# Patient Record
Sex: Female | Born: 1940 | Race: White | Hispanic: No
Health system: Southern US, Community
[De-identification: ages and names within clinical notes are randomized; demographics above are authoritative.]

## PROBLEM LIST (undated history)

## (undated) DIAGNOSIS — F419 Anxiety disorder, unspecified: Secondary | ICD-10-CM

## (undated) DIAGNOSIS — E119 Type 2 diabetes mellitus without complications: Secondary | ICD-10-CM

## (undated) DIAGNOSIS — E079 Disorder of thyroid, unspecified: Secondary | ICD-10-CM

## (undated) DIAGNOSIS — J45909 Unspecified asthma, uncomplicated: Secondary | ICD-10-CM

## (undated) DIAGNOSIS — J449 Chronic obstructive pulmonary disease, unspecified: Secondary | ICD-10-CM

## (undated) HISTORY — PX: CARDIAC SURGERY: SHX584

---

## 2019-10-08 ENCOUNTER — Emergency Department (HOSPITAL_COMMUNITY): Payer: Medicare Other

## 2019-10-08 ENCOUNTER — Other Ambulatory Visit: Payer: Self-pay

## 2019-10-08 ENCOUNTER — Encounter (HOSPITAL_COMMUNITY): Payer: Self-pay | Admitting: *Deleted

## 2019-10-08 DIAGNOSIS — R42 Dizziness and giddiness: Secondary | ICD-10-CM | POA: Diagnosis not present

## 2019-10-08 DIAGNOSIS — Z79899 Other long term (current) drug therapy: Secondary | ICD-10-CM | POA: Insufficient documentation

## 2019-10-08 DIAGNOSIS — R002 Palpitations: Secondary | ICD-10-CM | POA: Insufficient documentation

## 2019-10-08 DIAGNOSIS — E039 Hypothyroidism, unspecified: Secondary | ICD-10-CM | POA: Insufficient documentation

## 2019-10-08 DIAGNOSIS — J449 Chronic obstructive pulmonary disease, unspecified: Secondary | ICD-10-CM | POA: Insufficient documentation

## 2019-10-08 DIAGNOSIS — Z7982 Long term (current) use of aspirin: Secondary | ICD-10-CM | POA: Diagnosis not present

## 2019-10-08 DIAGNOSIS — E119 Type 2 diabetes mellitus without complications: Secondary | ICD-10-CM | POA: Insufficient documentation

## 2019-10-08 DIAGNOSIS — R2243 Localized swelling, mass and lump, lower limb, bilateral: Secondary | ICD-10-CM | POA: Diagnosis not present

## 2019-10-08 DIAGNOSIS — R000 Tachycardia, unspecified: Secondary | ICD-10-CM | POA: Diagnosis not present

## 2019-10-08 DIAGNOSIS — Z7984 Long term (current) use of oral hypoglycemic drugs: Secondary | ICD-10-CM | POA: Insufficient documentation

## 2019-10-08 MED ORDER — SODIUM CHLORIDE 0.9% FLUSH
3.0000 mL | Freq: Once | INTRAVENOUS | Status: AC
  Administered 2019-10-09: 3 mL via INTRAVENOUS

## 2019-10-08 NOTE — ED Triage Notes (Signed)
Pt says she feels like her heart is "fluttering" for a couple of days, worse today. Says she measured her pulse has been around 115, and someone felt her pulse and said it was irregular. Denies shortness of breath.

## 2019-10-09 ENCOUNTER — Encounter (HOSPITAL_COMMUNITY): Payer: Self-pay | Admitting: Emergency Medicine

## 2019-10-09 ENCOUNTER — Other Ambulatory Visit: Payer: Self-pay

## 2019-10-09 ENCOUNTER — Emergency Department (HOSPITAL_COMMUNITY)
Admission: EM | Admit: 2019-10-09 | Discharge: 2019-10-09 | Disposition: A | Discharge status: Home / Self Care | Payer: Medicare Other | Attending: Emergency Medicine | Admitting: Emergency Medicine

## 2019-10-09 DIAGNOSIS — R002 Palpitations: Secondary | ICD-10-CM

## 2019-10-09 HISTORY — DX: Type 2 diabetes mellitus without complications: E11.9

## 2019-10-09 HISTORY — DX: Unspecified asthma, uncomplicated: J45.909

## 2019-10-09 HISTORY — DX: Anxiety disorder, unspecified: F41.9

## 2019-10-09 HISTORY — DX: Disorder of thyroid, unspecified: E07.9

## 2019-10-09 HISTORY — DX: Chronic obstructive pulmonary disease, unspecified: J44.9

## 2019-10-09 LAB — URINALYSIS, ROUTINE W REFLEX MICROSCOPIC
Bilirubin Urine: NEGATIVE
Glucose, UA: NEGATIVE mg/dL
Hgb urine dipstick: NEGATIVE
Ketones, ur: NEGATIVE mg/dL
Nitrite: NEGATIVE
Protein, ur: NEGATIVE mg/dL
Specific Gravity, Urine: 1.01 (ref 1.005–1.030)
pH: 7 (ref 5.0–8.0)

## 2019-10-09 LAB — CBC
HCT: 41.2 % (ref 36.0–46.0)
Hemoglobin: 13.2 g/dL (ref 12.0–15.0)
MCH: 30.6 pg (ref 26.0–34.0)
MCHC: 32 g/dL (ref 30.0–36.0)
MCV: 95.4 fL (ref 80.0–100.0)
Platelets: 195 10*3/uL (ref 150–400)
RBC: 4.32 MIL/uL (ref 3.87–5.11)
RDW: 13.8 % (ref 11.5–15.5)
WBC: 8 10*3/uL (ref 4.0–10.5)
nRBC: 0 % (ref 0.0–0.2)

## 2019-10-09 LAB — TSH: TSH: 1.18 u[IU]/mL (ref 0.350–4.500)

## 2019-10-09 LAB — BASIC METABOLIC PANEL
Anion gap: 10 (ref 5–15)
BUN: 18 mg/dL (ref 8–23)
CO2: 27 mmol/L (ref 22–32)
Calcium: 9.1 mg/dL (ref 8.9–10.3)
Chloride: 104 mmol/L (ref 98–111)
Creatinine, Ser: 0.7 mg/dL (ref 0.44–1.00)
GFR calc Af Amer: >60 mL/min (ref >60)
GFR calc non Af Amer: >60 mL/min (ref >60)
Glucose, Bld: 109 mg/dL — ABNORMAL HIGH (ref 70–99)
Potassium: 3.5 mmol/L (ref 3.5–5.1)
Sodium: 141 mmol/L (ref 135–145)

## 2019-10-09 LAB — CBG MONITORING, ED: Glucose-Capillary: 97 mg/dL (ref 70–99)

## 2019-10-09 MED ORDER — SODIUM CHLORIDE 0.9 % IV BOLUS
1000.0000 mL | Freq: Once | INTRAVENOUS | Status: AC
  Administered 2019-10-09: 1000 mL via INTRAVENOUS

## 2019-10-09 NOTE — ED Notes (Signed)
Pt given graham crackers, peanut butter and diet Sprite per her request.

## 2019-10-09 NOTE — Discharge Instructions (Addendum)
You were seen in the emergency department for having more frequent palpitations.  You had a chest x-ray EKG and blood work that did not show any obvious abnormalities.  Your thyroid level was normal.  Please keep hydrated and follow-up with your regular doctor.  Return to the emergency department for any worsening or concerning symptoms.

## 2019-10-09 NOTE — ED Provider Notes (Signed)
[facility] EMERGENCY DEPARTMENT Provider Note   CSN: [id number] Arrival date & time: 10/08/19  2141     History Chief Complaint  Patient presents with  . Palpitations    Jody Chung is a 79 y.o. female.  She has a history of COPD diabetes thyroid disease.  She is here with 5 days of increased palpitations, feeling of fluttering in her chest.  She said she is had in the past but not as frequent as this.  Makes her feel little bit lightheaded.  No chest pain.  No shortness of breath.  She said one of her neighbors who is an EMT checked on her and found her heart rate to be 115 which is abnormal for her.  No recent changes in her medications.  No infectious symptoms.  The history is provided by the patient.  Palpitations Palpitations quality:  Irregular Onset quality:  Gradual Duration:  5 days Timing:  Intermittent Progression:  Unchanged Chronicity:  Recurrent Relieved by:  Nothing Worsened by:  Nothing Ineffective treatments:  None tried Associated symptoms: dizziness   Associated symptoms: no chest pain, no cough, no diaphoresis, no nausea, no shortness of breath and no vomiting   Risk factors: diabetes mellitus and hx of thyroid disease        Past Medical History:  Diagnosis Date  . Anxiety   . Asthma   . COPD (chronic obstructive pulmonary disease) (HCC)   . Diabetes mellitus without complication (HCC)   . Thyroid disease     There are no problems to display for this patient.   Past Surgical History:  Procedure Laterality Date  . CARDIAC SURGERY       OB History   No obstetric history on file.     No family history on file.  Social History   Tobacco Use  . Smoking status: Not on file  Substance Use Topics  . Alcohol use: Not on file  . Drug use: Not on file    Home Medications Prior to Admission medications   Medication Sig Start Date End Date Taking? Authorizing Provider  albuterol (VENTOLIN HFA) 108 (90 Base) MCG/ACT inhaler Inhale 2 puffs  into the lungs every 6 (six) hours as needed. 12/15/16  Yes [provider]  aspirin 81 MG EC tablet Take 1 tablet by mouth daily.   Yes [provider]  calcium carbonate (OSCAL) 1500 (600 Ca) MG TABS tablet Take 600 mg of elemental calcium by mouth daily with breakfast.   Yes [provider]  Cholecalciferol 25 MCG (1000 UT) capsule Take 1 capsule by mouth daily.   Yes [provider]  conjugated estrogens (PREMARIN) vaginal cream Place 0.25 g vaginally once a week. 05/03/19  Yes [provider]  ELDERBERRY PO Take 1,250 mg by mouth daily.   Yes [provider]  fluconazole (DIFLUCAN) 150 MG tablet Take 1 tablet by mouth daily as needed. 06/12/19  Yes [provider]  glucosamine-chondroitin 500-400 MG tablet Take 3 tablets by mouth daily.   Yes [provider]  levothyroxine (SYNTHROID) 75 MCG tablet Take 75 mcg by mouth daily. 07/22/19  Yes [provider]  metFORMIN (GLUCOPHAGE) 500 MG tablet Take 500 mg by mouth 2 (two) times daily. 05/03/19  Yes [provider]  Multiple Vitamin (MULTIVITAMIN WITH MINERALS) TABS tablet Take 1 tablet by mouth daily.   Yes [provider]  Multiple Vitamins-Minerals (HAIR SKIN AND NAILS FORMULA) TABS Take 1 tablet by mouth daily.   Yes [provider]  niacin 500 MG tablet Take 1 tablet by mouth daily.   Yes [provider]  omeprazole (PRILOSEC) 20 MG capsule Take 20 mg by mouth daily. 05/08/19  Yes [provider]  Probiotic Product (MISC INTESTINAL FLORA REGULAT) CHEW Chew 1 tablet by mouth daily.   Yes [provider]  tiotropium (SPIRIVA) 18 MCG inhalation capsule Place 1 capsule into inhaler and inhale daily. 12/15/16  Yes [provider]  Turmeric (QC TUMERIC COMPLEX) 500 MG CAPS Take 1 capsule by mouth daily.   Yes [provider]  vitamin B-12 (CYANOCOBALAMIN) 1000 MCG tablet Take 1,000 mcg by mouth daily.    Yes [provider]  Monte Fantasia INHUB 250-50 MCG/DOSE AEPB Inhale 1 puff into the lungs 2 (two) times daily. 08/30/19  Yes [provider]    Allergies    Patient has no allergy information on record.  Review of Systems   Review of Systems  Constitutional: Negative for diaphoresis and fever.  HENT: Negative for sore throat.   Eyes: Negative for visual disturbance.  Respiratory: Negative for cough and shortness of breath.   Cardiovascular: Positive for palpitations. Negative for chest pain.  Gastrointestinal: Negative for abdominal pain, nausea and vomiting.  Genitourinary: Negative for dysuria.  Musculoskeletal: Negative for neck pain.  Skin: Positive for wound (rle healing from fall). Negative for rash.  Neurological: Positive for dizziness.    Physical Exam Updated Vital Signs BP (!) 150/73 (BP Location: Left Arm)   Pulse 73   Temp 97.8 F (36.6 C) (Oral)   Resp 18   Ht 5\' 3"  (1.6 m)   Wt 73.5 kg   SpO2 95%   BMI 28.70 kg/m   Physical Exam Vitals and nursing note reviewed.  Constitutional:      General: She is not in acute distress.    Appearance: Normal appearance. She is well-developed.  HENT:     Head: Normocephalic and atraumatic.  Eyes:     Conjunctiva/sclera: Conjunctivae normal.  Cardiovascular:     Rate and Rhythm: Normal rate and regular rhythm.     Heart sounds: No murmur heard.   Pulmonary:     Effort: Pulmonary effort is normal. No respiratory distress.     Breath sounds: Normal breath sounds.  Abdominal:     Palpations: Abdomen is soft.     Tenderness: There is no abdominal tenderness.  Musculoskeletal:        General: Signs of injury present. Normal range of motion.     Cervical back: Neck supple.     Comments: Swelling anterior and tender RLE, no calf pain  Skin:    General: Skin is warm and dry.     Capillary Refill: Capillary refill takes less than 2 seconds.  Neurological:     General: No focal deficit present.     Mental  Status: She is alert.     ED Results / Procedures / Treatments   Labs (all labs ordered are listed, but only abnormal results are displayed) Labs Reviewed  BASIC METABOLIC PANEL - Abnormal; Notable for the following components:      Result Value   Glucose, Bld 109 (*)    All other components within normal limits  URINALYSIS, ROUTINE W REFLEX MICROSCOPIC - Abnormal; Notable for the following components:   APPearance CLOUDY (*)    Leukocytes,Ua MODERATE (*)    Bacteria, UA RARE (*)    All other components within normal limits  URINE CULTURE  CBC  TSH  CBG  MONITORING, ED    EKG EKG Interpretation  Date/Time:  Monday October 08 2019 22:03:06 EDT Ventricular Rate:  102 PR Interval:  166 QRS Duration: 64 QT Interval:  336 QTC Calculation: 437 R Axis:   81 Text Interpretation: Sinus tachycardia Nonspecific ST abnormality Abnormal ECG Interpretation limited secondary to artifact No previous ECGs available Confirmed by Zadie Rhine ([id number]) on 10/09/2019 3:01:53 AM   Radiology DG Chest 2 View  Result Date: 10/08/2019 CLINICAL DATA:  Palpitations, tachycardia EXAM: CHEST - 2 VIEW COMPARISON:  None. FINDINGS: The heart size and mediastinal contours are within normal limits. Both lungs are clear. The visualized skeletal structures are unremarkable. IMPRESSION: No active cardiopulmonary disease. Electronically Signed   By: Helyn Numbers MD   On: 10/08/2019 22:46    Procedures Procedures (including critical care time)  Medications Ordered in ED Medications  sodium chloride flush (NS) 0.9 % injection 3 mL (3 mLs Intravenous Given 10/09/19 1008)  sodium chloride 0.9 % bolus 1,000 mL (0 mLs Intravenous Stopped 10/09/19 1115)    ED Course  I have reviewed the triage vital signs and the nursing notes.  Pertinent labs & imaging results that were available during my care of the patient were reviewed by me and considered in my medical decision making (see chart for  details).  Clinical Course as of Oct 08 1804  Tue Oct 09, 2019  9211 Repeat EKG shows normal sinus rhythm normal intervals no acute ST-T's.  Rate of 69.   [MB]  1053 While patient was here she began experiencing shaking and feeling fluttering in her chest.  She looks very anxious and distressed by this.  Heart rate remained in the 80s.  Due to her trembling hard to see of sinus but looks regular.  IV fluids are infusing so do not know if she is just chilled from that.  Giving her warm blankets and she is eating something.   [MB]  1104 Reevaluated patient and she is feeling better now   [MB]    Clinical Course User Index [MB] Terrilee Files, MD   MDM Rules/Calculators/A&P                         This patient complains of fluttering in her chest; this involves an extensive number of treatment Options and is a complaint that carries with it a high risk of complications and Morbidity. The differential includes palpitations, A. fib, a flutter, arrhythmia, PVCs, metabolic derangement, ACS, pneumonia, GERD, anxiety  I ordered, reviewed and interpreted labs, which included CBC with normal white count normal hemoglobin, chemistries normal other than mildly elevated glucose, TSH normal, urinalysis likely contaminated with 21-50 whites but 21-50 squamous cells nitrite negative sent for culture I ordered medication IV fluids I ordered imaging studies which included chest x-ray and I independently    visualized and interpreted imaging which showed no acute disease Additional history obtained from patient's son Previous records obtained and reviewed in epic, minimal records, she is from [place]  After the interventions stated above, I reevaluated the patient and found patient to be feeling improved.  Reviewed her work-up with her.  She is exhausted from being her so long.  Recommend follow-up with her primary care doctor and return instructions discussed.   Final Clinical Impression(s) / ED  Diagnoses Final diagnoses:  Palpitations    Rx / DC Orders ED Discharge Orders    None       Terrilee Files, MD 10/09/19  1808  

## 2019-10-10 LAB — URINE CULTURE

## 2020-12-10 IMAGING — DX DG CHEST 2V
2 series · 2 of 2 positions shown · non-contrast
Comparison: None.

CLINICAL DATA: Palpitations, tachycardia

EXAM:
CHEST - 2 VIEW

[chest lat]
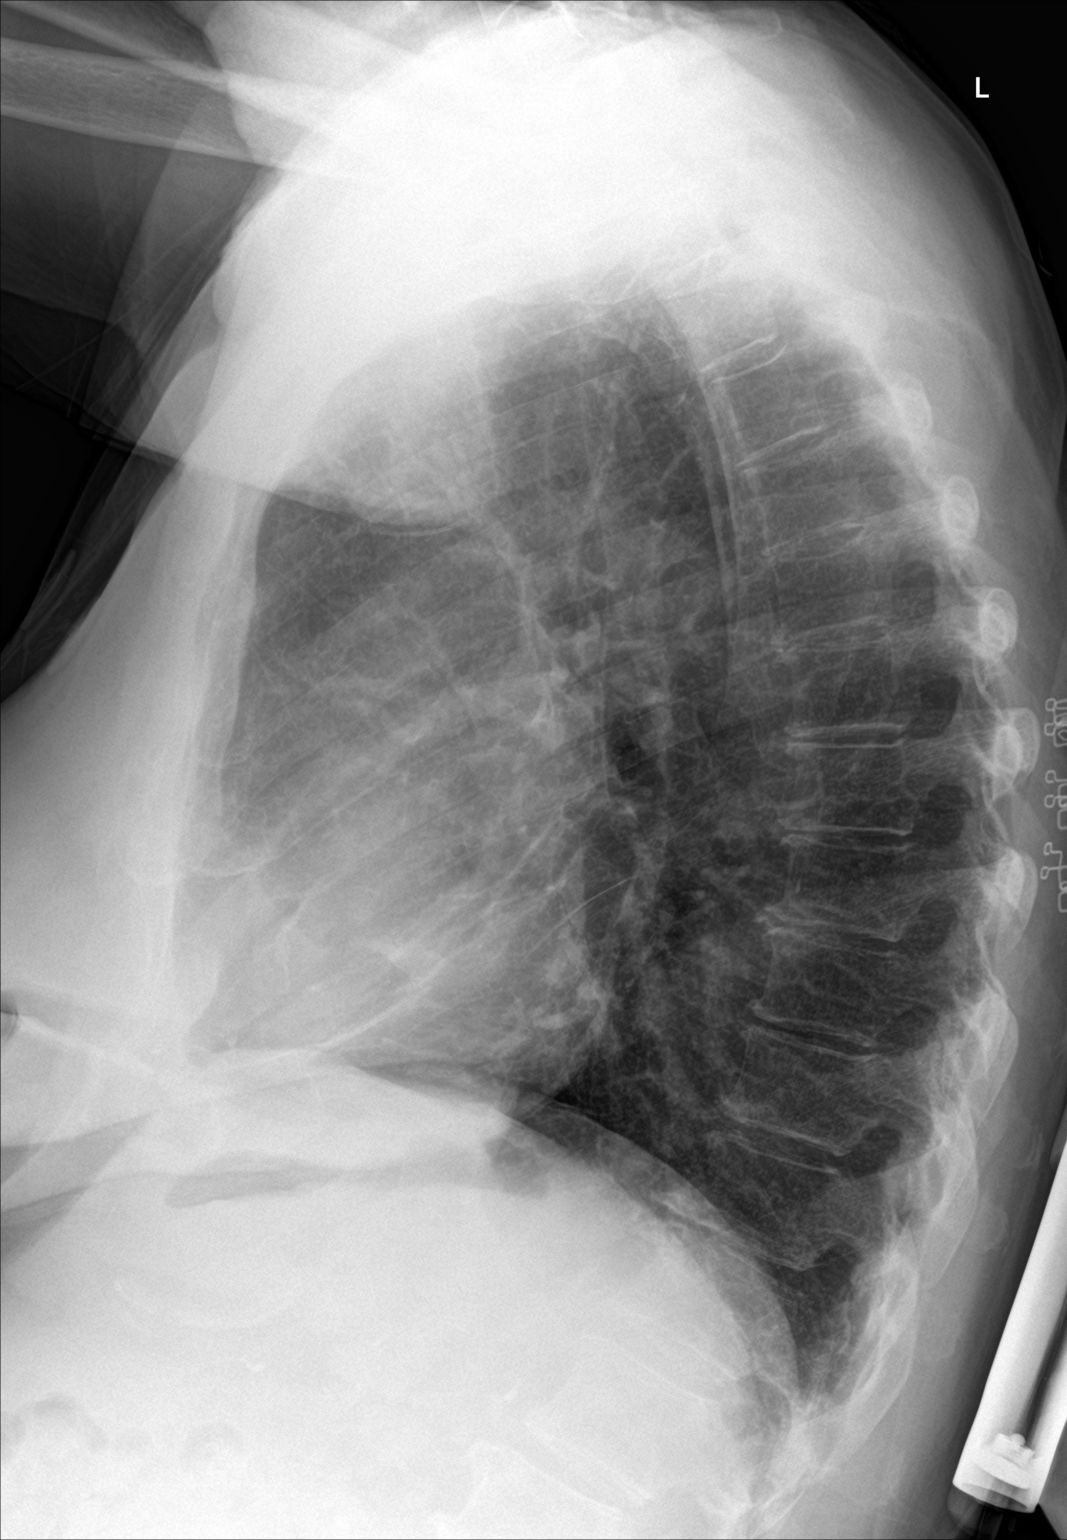

[chest ap]
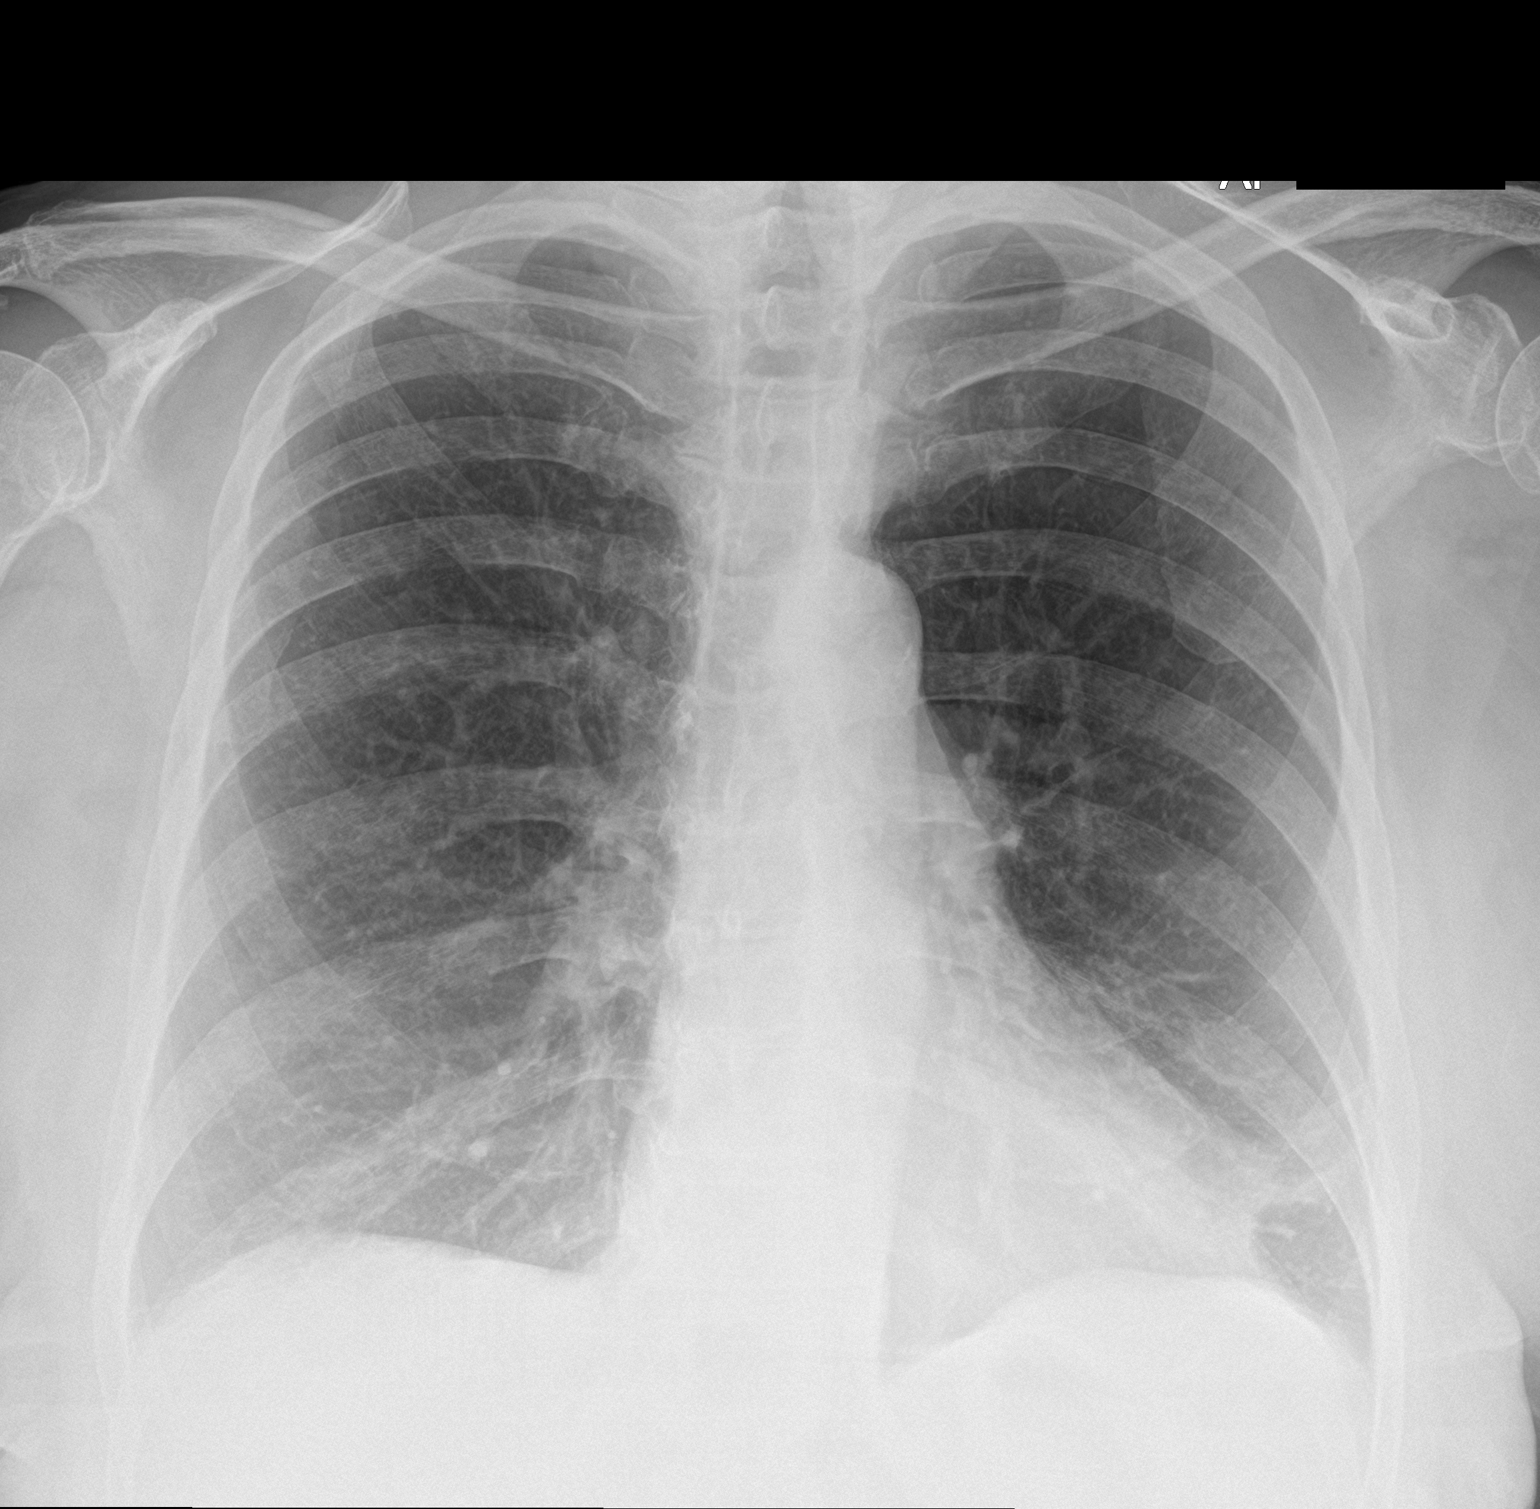

[2 of 2 positions shown; findings below may reference images not displayed]

FINDINGS: The heart size and mediastinal contours are within normal limits.
Both lungs are clear. The visualized skeletal structures are
unremarkable.
IMPRESSION: No active cardiopulmonary disease.
